# Patient Record
Sex: Male | Born: 2005
Health system: Southern US, Community
[De-identification: ages and names within clinical notes are randomized; demographics above are authoritative.]

## PROBLEM LIST (undated history)

## (undated) DIAGNOSIS — E559 Vitamin D deficiency, unspecified: Secondary | ICD-10-CM

## (undated) DIAGNOSIS — J45909 Unspecified asthma, uncomplicated: Secondary | ICD-10-CM

## (undated) DIAGNOSIS — T7840XA Allergy, unspecified, initial encounter: Secondary | ICD-10-CM

## (undated) DIAGNOSIS — F419 Anxiety disorder, unspecified: Secondary | ICD-10-CM

## (undated) DIAGNOSIS — F988 Other specified behavioral and emotional disorders with onset usually occurring in childhood and adolescence: Secondary | ICD-10-CM

## (undated) DIAGNOSIS — R7303 Prediabetes: Secondary | ICD-10-CM

## (undated) DIAGNOSIS — F32A Depression, unspecified: Secondary | ICD-10-CM

## (undated) DIAGNOSIS — R030 Elevated blood-pressure reading, without diagnosis of hypertension: Secondary | ICD-10-CM

## (undated) HISTORY — DX: Vitamin D deficiency, unspecified: E55.9

## (undated) HISTORY — DX: Anxiety disorder, unspecified: F41.9

## (undated) HISTORY — DX: Depression, unspecified: F32.A

## (undated) HISTORY — DX: Elevated blood-pressure reading, without diagnosis of hypertension: R03.0

## (undated) HISTORY — DX: Other specified behavioral and emotional disorders with onset usually occurring in childhood and adolescence: F98.8

## (undated) HISTORY — PX: WISDOM TOOTH EXTRACTION: SHX21

## (undated) HISTORY — DX: Prediabetes: R73.03

## (undated) HISTORY — DX: Allergy, unspecified, initial encounter: T78.40XA

---
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
## 2025-01-28 ENCOUNTER — Telehealth: Payer: Self-pay | Admitting: Nurse Practitioner
